# Patient Record
Sex: Male | Born: 1935 | Race: White | Hispanic: No | Smoking: Current every day smoker
Health system: Southern US, Community
[De-identification: ages and names within clinical notes are randomized; demographics above are authoritative.]

## PROBLEM LIST (undated history)

## (undated) DIAGNOSIS — I10 Essential (primary) hypertension: Secondary | ICD-10-CM

## (undated) DIAGNOSIS — K2270 Barrett's esophagus without dysplasia: Secondary | ICD-10-CM

## (undated) DIAGNOSIS — K5790 Diverticulosis of intestine, part unspecified, without perforation or abscess without bleeding: Secondary | ICD-10-CM

## (undated) HISTORY — DX: Diverticulosis of intestine, part unspecified, without perforation or abscess without bleeding: K57.90

## (undated) HISTORY — DX: Barrett's esophagus without dysplasia: K22.70

## (undated) HISTORY — DX: Essential (primary) hypertension: I10

---

## 1964-09-29 HISTORY — PX: OTHER SURGICAL HISTORY: SHX169

## 2007-09-30 DIAGNOSIS — I10 Essential (primary) hypertension: Secondary | ICD-10-CM

## 2007-09-30 HISTORY — DX: Essential (primary) hypertension: I10

## 2009-09-07 ENCOUNTER — Ambulatory Visit: Payer: Self-pay | Admitting: Internal Medicine

## 2009-10-02 ENCOUNTER — Ambulatory Visit: Payer: Self-pay | Admitting: Internal Medicine

## 2009-10-20 ENCOUNTER — Emergency Department: Payer: Self-pay | Admitting: Emergency Medicine

## 2009-11-08 ENCOUNTER — Ambulatory Visit: Payer: Self-pay | Admitting: Internal Medicine

## 2009-12-04 ENCOUNTER — Ambulatory Visit: Payer: Self-pay | Admitting: Surgery

## 2009-12-05 ENCOUNTER — Ambulatory Visit: Payer: Self-pay | Admitting: Surgery

## 2009-12-10 ENCOUNTER — Inpatient Hospital Stay: Payer: Self-pay | Admitting: Surgery

## 2009-12-11 ENCOUNTER — Ambulatory Visit: Payer: Self-pay | Admitting: Cardiovascular Disease

## 2010-09-29 DIAGNOSIS — K5790 Diverticulosis of intestine, part unspecified, without perforation or abscess without bleeding: Secondary | ICD-10-CM

## 2010-09-29 HISTORY — PX: UPPER GI ENDOSCOPY: SHX6162

## 2010-09-29 HISTORY — DX: Diverticulosis of intestine, part unspecified, without perforation or abscess without bleeding: K57.90

## 2010-09-29 HISTORY — PX: COLONOSCOPY: SHX174

## 2011-01-25 IMAGING — CT CT ANGIOGRAPHY NECK
1 of 4 series · 12 of 33 positions shown · IV contrast (APPLIED)
Comparison: none

REASON FOR EXAM: bilateral carotid stenosis
COMMENTS:

[Series 4: soft tissue · axial · 0.53mm/px · z∈[-306,-45]mm · 12 of 103 slices shown]
[im 8/103  soft-tissue]
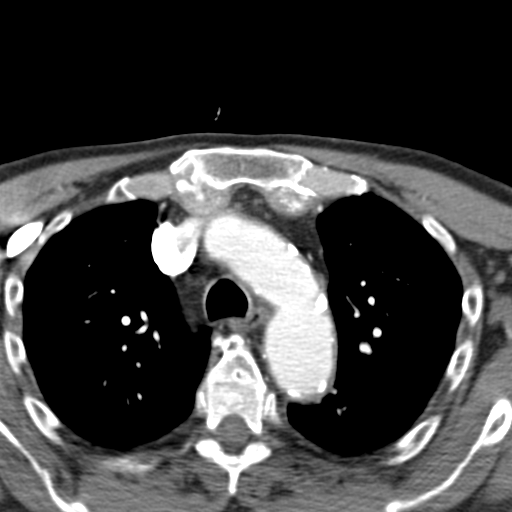
[im 16/103  bone]
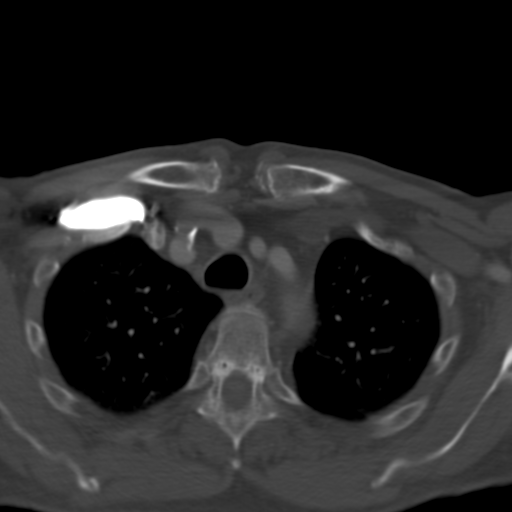
[im 24/103  soft-tissue]
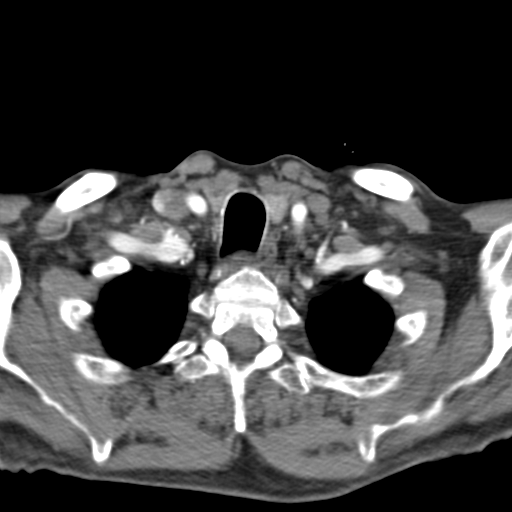
[im 32/103  bone]
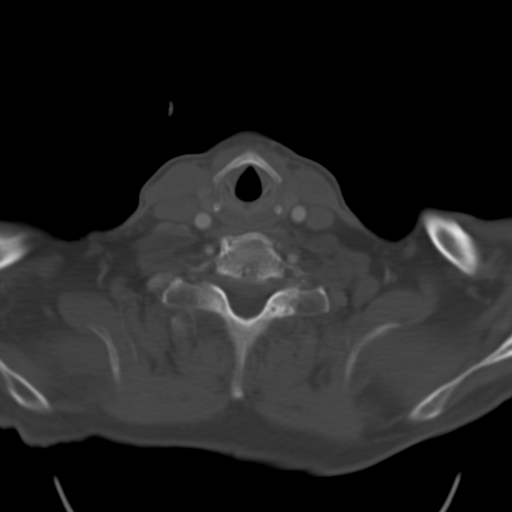
[im 40/103  soft-tissue]
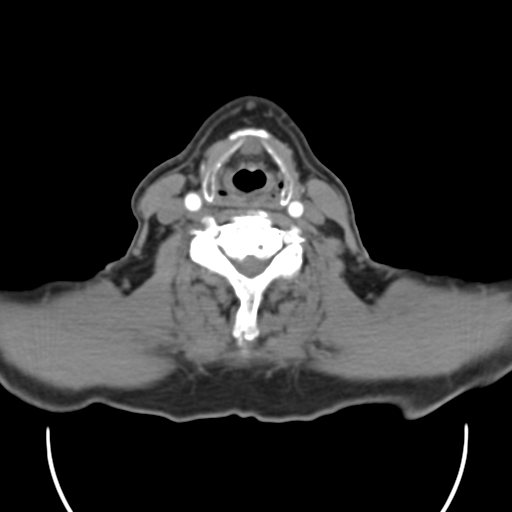
[im 48/103  bone]
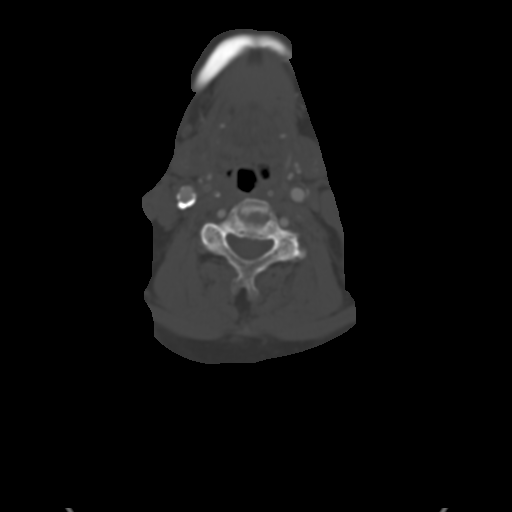
[im 55/103  soft-tissue]
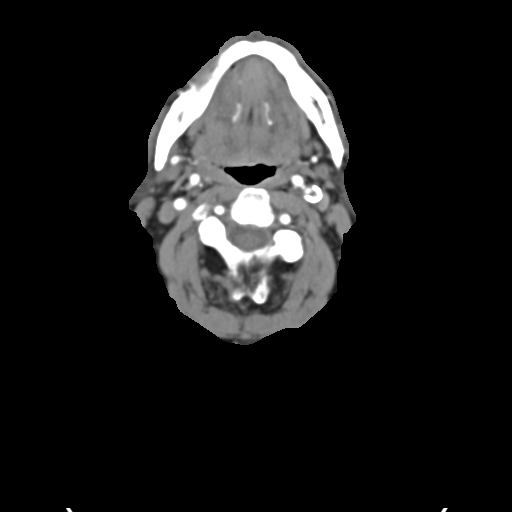
[im 63/103  bone]
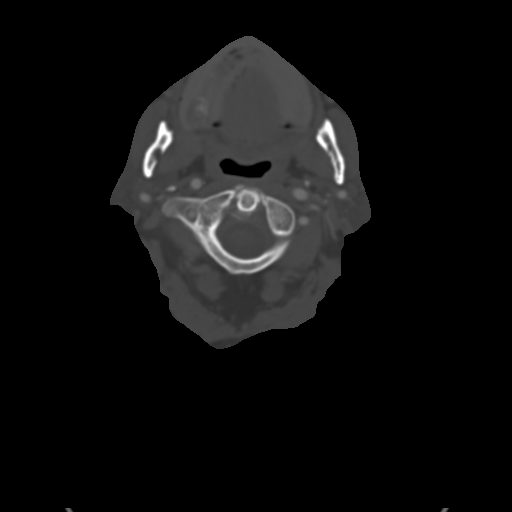
[im 71/103  soft-tissue]
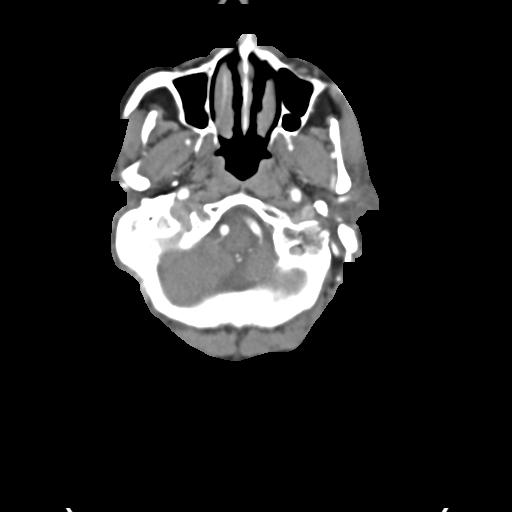
[im 79/103  bone]
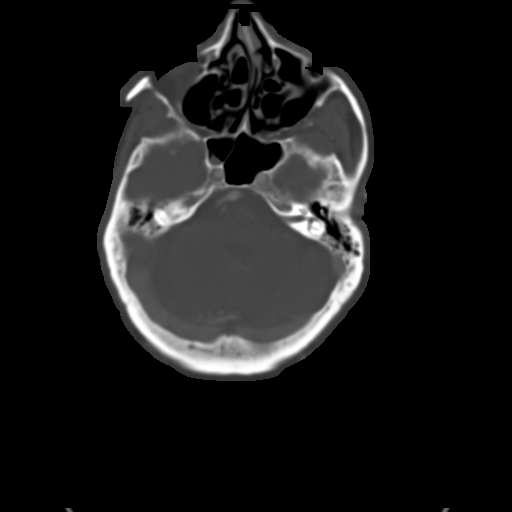
[im 87/103  soft-tissue]
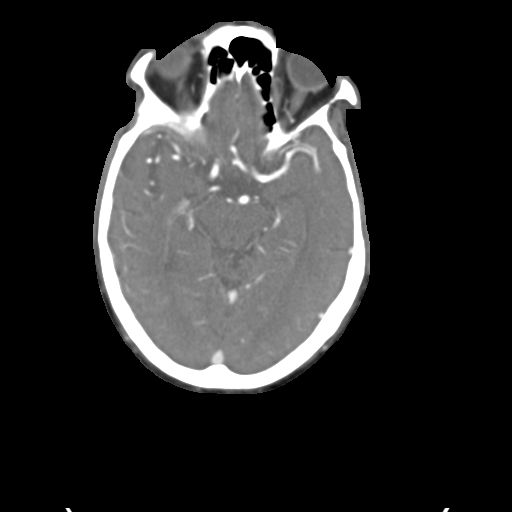
[im 95/103  bone]
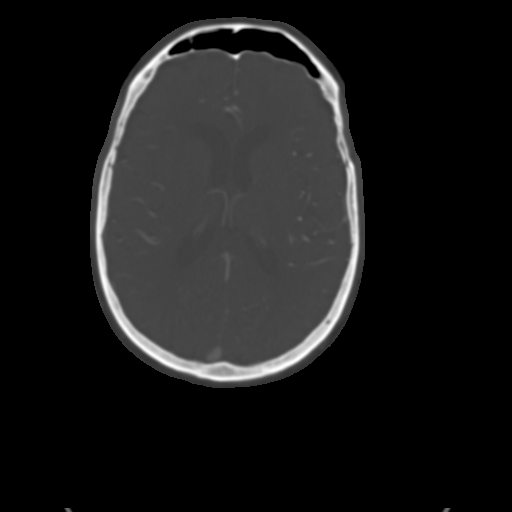

[12 of 33 positions shown; findings below may reference images not displayed]

PROCEDURE:     CT  - CT ANGIOGRAPHY NECK W/CONTRAST  - November 08, 2009  [DATE]

RESULT:

Axial source images were obtained as well as coronal and sagittal maximum
intensity projections status post intravenous administration of 100 ml
Isovue 370. Also included are 3-D reconstructions.

Evaluation of the left carotid system demonstrates coarse mural
calcifications within the carotid bulb. These extend into the origin of the
internal carotid artery. Findings concerning for high-grade stenosis at the
origin of the internal carotid artery are appreciated. This appears to be on
the magnitude of 90 to 95%. There is also narrowing at the origin of the
external carotid artery of the magnitude of 80 to 85%. Moderate to severe
narrowing is appreciated within the carotid bulb on the magnitude of 60 to
65%. The carotid bulb findings are partially obscured by coarse mural
calcifications.

Evaluation of the right carotid system demonstrates coarse mural
calcifications within the carotid bulb extending into the proximal internal
carotid artery. There is a region concerning for high-grade stenosis along
the proximal internal carotid artery. This is concerning for stenosis in the
magnitude of 80 to 85% though this finding is partially obscured by the
course mural calcifications. Mild to moderate stenosis is appreciated within
the carotid bulb on the magnitude of 40 to 45%.

No further areas of segmental nor focal narrowing are identified nor
fusiform or focal outpouchings within the right or left carotid systems.
IMPRESSION: 1. Findings concerning for/consistent with areas of hemodynamically
significant stenosis within the proximal right carotid artery as well as the
origin and proximal left carotid arteries. These are the internal carotid
arteries.
2. These findings are partially obscured by coarse mural calcifications and
further evaluation with Vascular Interventional consultation is recommended.

## 2011-02-18 ENCOUNTER — Ambulatory Visit: Payer: Self-pay

## 2011-09-12 ENCOUNTER — Ambulatory Visit: Payer: Self-pay | Admitting: General Surgery

## 2012-05-06 IMAGING — CR DG HAND COMPLETE 3+V*L*
1 series · 4 of 4 positions shown · non-contrast
Comparison: none

REASON FOR EXAM: left hand finger pain---all fingers
COMMENTS:

[Series 1: view not recorded · 0.17mm/px · 4 of 4 slices shown]
[im 1/4]
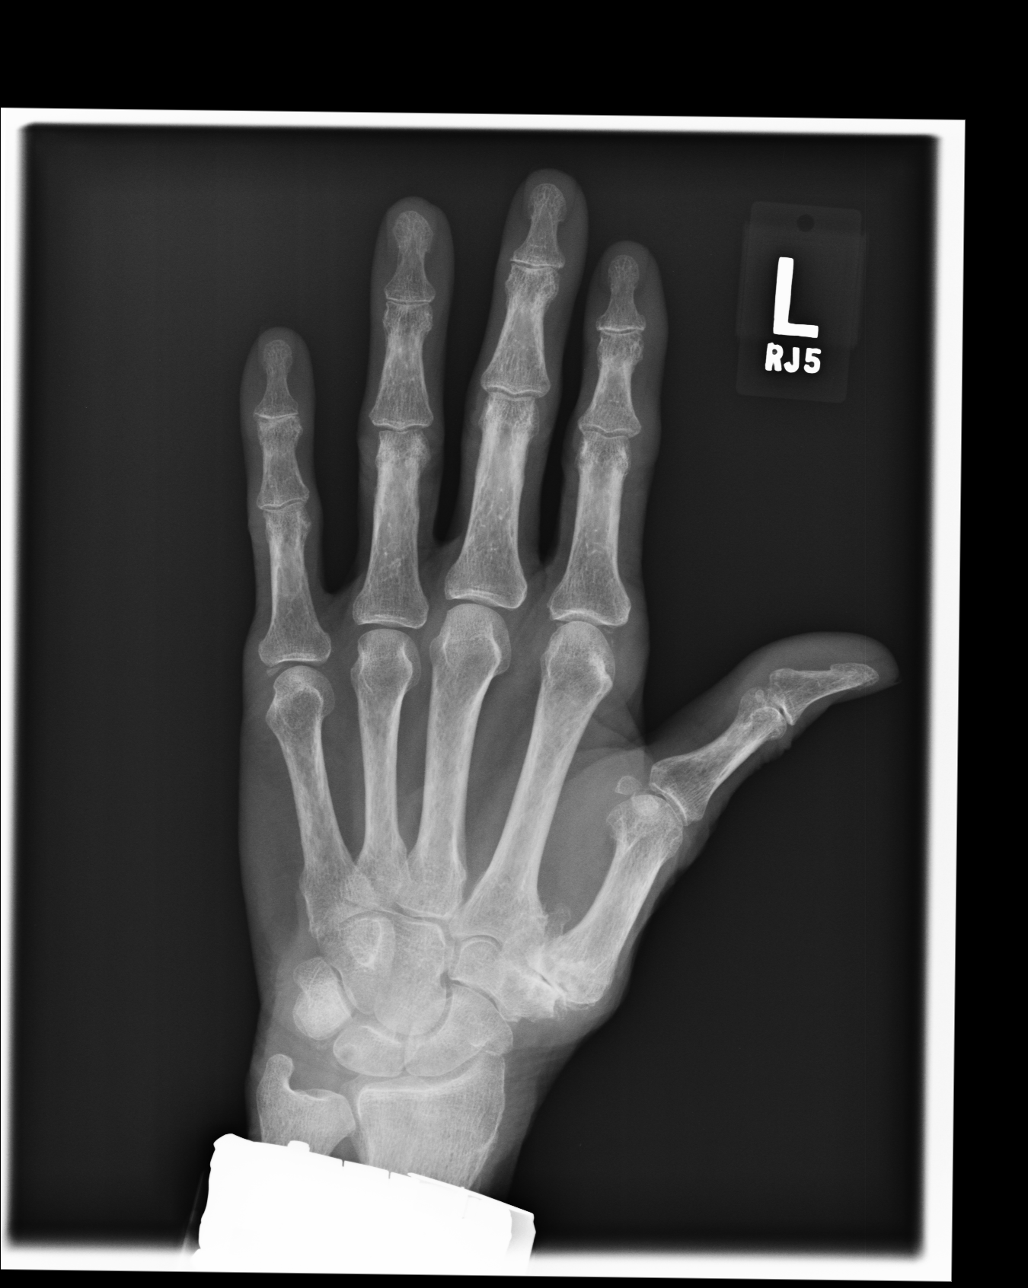
[im 2/4]
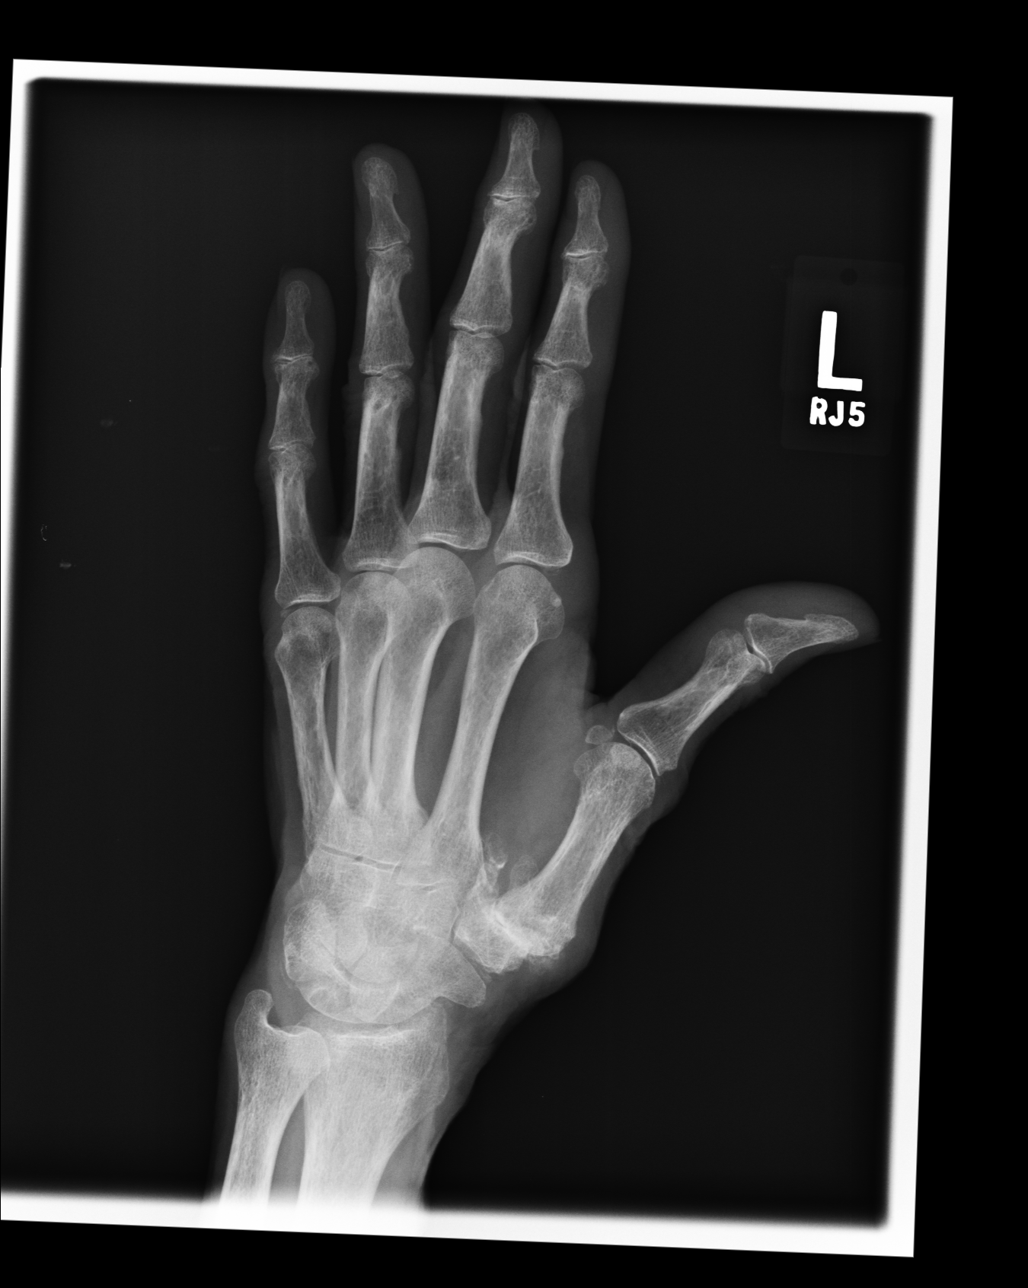
[im 3/4]
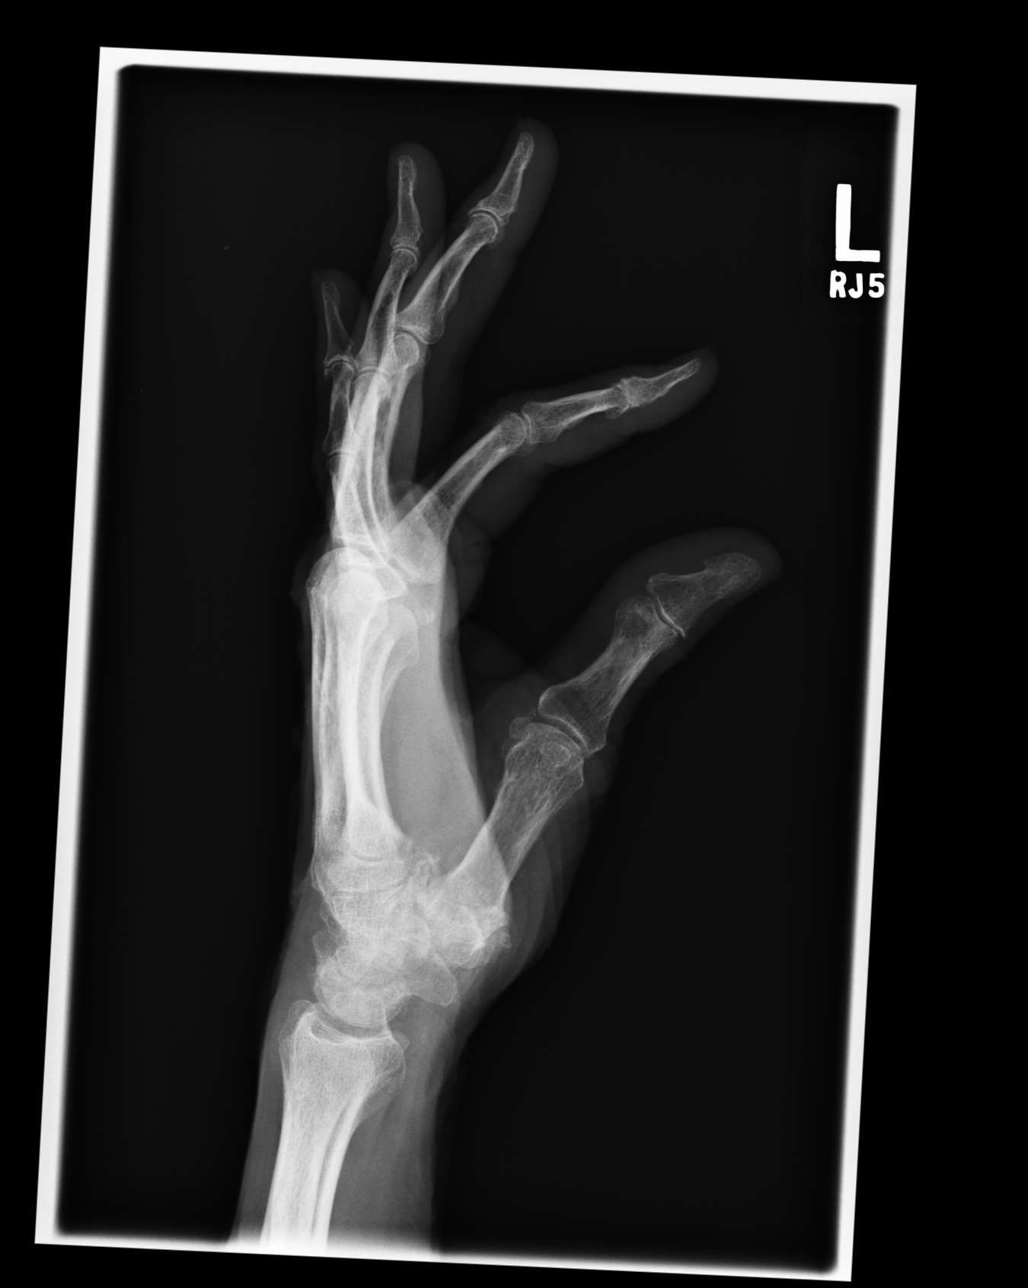
[im 4/4]
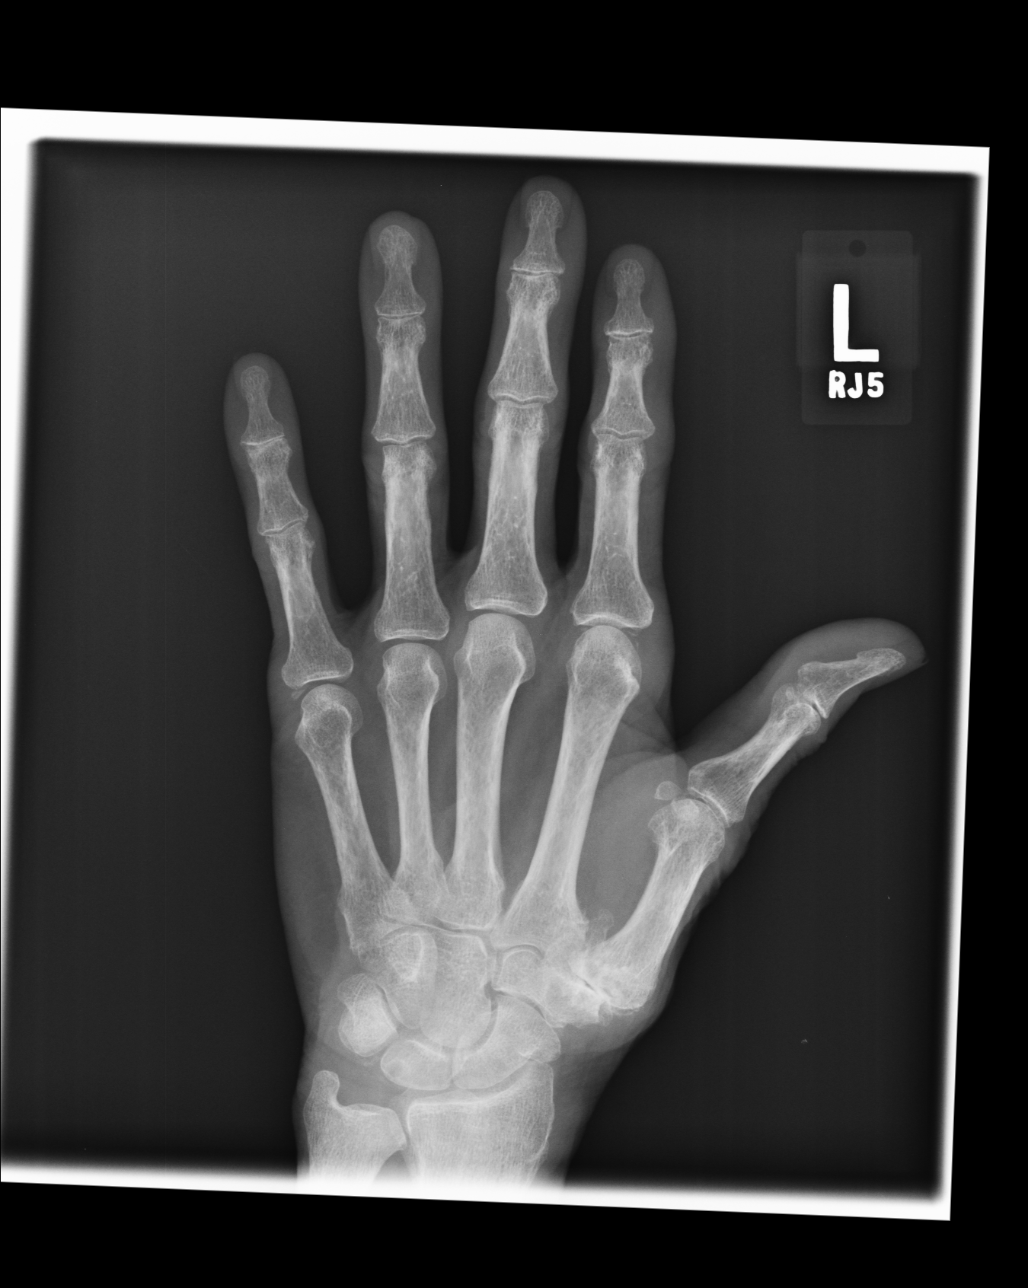

[4 of 4 positions shown; findings below may reference images not displayed]

PROCEDURE:     DXR - DXR HAND LT COMPLETE  W/OBLIQUES  - February 18, 2011 [DATE]

RESULT:     No fracture, dislocation or other acute bony abnormality is
identified. There is narrowing of the DIP joints of the second through the
fifth fingers compatible with arthritic change. Mild spur formation is also
present. No definite arthritic changes are seen at the PIP or MP joints.
Arthritic change is noted at the first carpometacarpal articulation. No
lytic or blastic lesions are seen.
IMPRESSION: 1. Arthritic change is noted at the DIP joints of the second through the
fifth fingers as noted above.
2. Arthritic change is also noted at the articulation of the first
metacarpal with the greater multangular.

## 2013-01-10 ENCOUNTER — Encounter: Payer: Self-pay | Admitting: General Surgery

## 2013-03-03 ENCOUNTER — Ambulatory Visit (INDEPENDENT_AMBULATORY_CARE_PROVIDER_SITE_OTHER): Payer: Medicare Other | Admitting: General Surgery

## 2013-03-03 ENCOUNTER — Encounter: Payer: Self-pay | Admitting: General Surgery

## 2013-03-03 VITALS — BP 118/62 | HR 72 | Resp 16 | Ht 69.0 in | Wt 173.0 lb

## 2013-03-03 DIAGNOSIS — K219 Gastro-esophageal reflux disease without esophagitis: Secondary | ICD-10-CM | POA: Insufficient documentation

## 2013-03-03 DIAGNOSIS — K2270 Barrett's esophagus without dysplasia: Secondary | ICD-10-CM

## 2013-03-03 NOTE — Progress Notes (Signed)
Patient ID: UNIQUE SEARFOSS, male   DOB: 1929/07/29, 79 y.o.   MRN: [id number]  Chief Complaint  Patient presents with  . Other    Barrett's esophagus    HPI Hayden Johnson is a 79 y.o. male here today for his six months Barrett's esophagus. The patient states he is doing well and has no complaints at this time. Pt has Barret's without dysplasia by prior endoscopy and biopsy. He is on Protonix but he cannot confirm his current meds-his daughter  Takes care of this. HPI  Past Medical History  Diagnosis Date  . Hypertension 2009  . Barrett's esophagus   . Diverticulosis 2012    Past Surgical History  Procedure Laterality Date  . Kidney stones  1966  . Colonoscopy  2012  . Upper gi endoscopy  2012    History reviewed. No pertinent family history.  Social History History  Substance Use Topics  . Smoking status: Current Every Day Smoker -- 0.50 packs/day for 30 years    Types: Cigarettes  . Smokeless tobacco: Never Used  . Alcohol Use: No    No Known Allergies  No current outpatient prescriptions on file.   No current facility-administered medications for this visit.    Review of Systems Review of Systems  Constitutional: Negative.   Respiratory: Negative.   Cardiovascular: Negative.     Blood pressure 118/62, pulse 72, resp. rate 16, height 5\' 9"  (1.753 m), weight 173 lb (78.472 kg).  Physical Exam Physical Exam  Constitutional: He appears well-developed and well-nourished.  Eyes: Conjunctivae are normal. No scleral icterus.  Neck: Trachea normal. No mass and no thyromegaly present.  Cardiovascular: Normal rate, regular rhythm, normal heart sounds and normal pulses.   No murmur heard. Pulmonary/Chest: Effort normal and breath sounds normal.  Abdominal: Soft. Normal appearance and bowel sounds are normal. There is no hepatosplenomegaly. There is no tenderness. No hernia.    Data Reviewed none  Assessment    Barret.s esophagus -symptomatically better.      Plan    Need to verify his meds.No need for endoscopy as long as he is asymptomatic.        Lorcan Shelp G 03/04/2013, 6:29 AM

## 2013-03-03 NOTE — Patient Instructions (Addendum)
Patient advised to continue use of Protonix. As patient is not aware of medications will have RN confirm his medications with his daughter or granddaughter.

## 2013-03-04 ENCOUNTER — Encounter: Payer: Self-pay | Admitting: General Surgery

## 2013-06-28 ENCOUNTER — Inpatient Hospital Stay: Payer: Self-pay | Admitting: Internal Medicine

## 2013-06-28 LAB — TROPONIN I: Troponin-I: 0.06 ng/mL — ABNORMAL HIGH

## 2013-06-28 LAB — BASIC METABOLIC PANEL
BUN: 56 mg/dL — ABNORMAL HIGH (ref 7–18)
Calcium, Total: 9.1 mg/dL (ref 8.5–10.1)
Co2: 24 mmol/L (ref 21–32)
Creatinine: 4.78 mg/dL — ABNORMAL HIGH (ref 0.60–1.30)
EGFR (African American): 12 — ABNORMAL LOW
Osmolality: 301 (ref 275–301)
Potassium: 4.8 mmol/L (ref 3.5–5.1)

## 2013-06-28 LAB — PRO B NATRIURETIC PEPTIDE: B-Type Natriuretic Peptide: 32243 pg/mL — ABNORMAL HIGH (ref 0–450)

## 2013-06-28 LAB — URINALYSIS, COMPLETE
Blood: NEGATIVE
Glucose,UR: 50 mg/dL (ref 0–75)
Hyaline Cast: 3
Ketone: NEGATIVE
Protein: 100
RBC,UR: 1 /HPF (ref 0–5)

## 2013-06-28 LAB — CBC
HGB: 9.9 g/dL — ABNORMAL LOW (ref 13.0–18.0)
MCHC: 33.8 g/dL (ref 32.0–36.0)
Platelet: 122 10*3/uL — ABNORMAL LOW (ref 150–440)
RBC: 3.02 10*6/uL — ABNORMAL LOW (ref 4.40–5.90)
RDW: 14.2 % (ref 11.5–14.5)
WBC: 10 10*3/uL (ref 3.8–10.6)

## 2013-06-28 LAB — CK TOTAL AND CKMB (NOT AT ARMC)
CK, Total: 98 U/L (ref 35–232)
CK, Total: 122 U/L (ref 35–232)
CK-MB: 3.2 ng/mL (ref 0.5–3.6)
CK-MB: 3.1 ng/mL (ref 0.5–3.6)
CK-MB: 2.6 ng/mL (ref 0.5–3.6)

## 2013-06-28 LAB — HEPATIC FUNCTION PANEL A (ARMC)
Albumin: 3.7 g/dL (ref 3.4–5.0)
Bilirubin,Total: 0.5 mg/dL (ref 0.2–1.0)
SGPT (ALT): 23 U/L (ref 12–78)

## 2013-06-29 LAB — PROTEIN / CREATININE RATIO, URINE
Creatinine, Urine: 162.7 mg/dL — ABNORMAL HIGH (ref 30.0–125.0)
Protein/Creat. Ratio: 2028 mg/gCREAT — ABNORMAL HIGH (ref 0–200)

## 2013-06-29 LAB — CBC WITH DIFFERENTIAL/PLATELET
Basophil %: 0 %
Eosinophil #: 0 10*3/uL (ref 0.0–0.7)
HCT: 26.7 % — ABNORMAL LOW (ref 40.0–52.0)
Lymphocyte #: 0.4 10*3/uL — ABNORMAL LOW (ref 1.0–3.6)
MCH: 33 pg (ref 26.0–34.0)
Monocyte #: 0.1 x10 3/mm — ABNORMAL LOW (ref 0.2–1.0)
Monocyte %: 1.8 %
Neutrophil #: 7.3 10*3/uL — ABNORMAL HIGH (ref 1.4–6.5)
Neutrophil %: 93 %
Platelet: 95 10*3/uL — ABNORMAL LOW (ref 150–440)
RDW: 14.2 % (ref 11.5–14.5)
WBC: 7.8 10*3/uL (ref 3.8–10.6)

## 2013-06-29 LAB — IRON AND TIBC
Iron Bind.Cap.(Total): 261 ug/dL (ref 250–450)
Iron Saturation: 23 %
Iron: 60 ug/dL — ABNORMAL LOW (ref 65–175)
Unbound Iron-Bind.Cap.: 201 ug/dL

## 2013-06-29 LAB — BASIC METABOLIC PANEL
Anion Gap: 10 (ref 7–16)
BUN: 69 mg/dL — ABNORMAL HIGH (ref 7–18)
Calcium, Total: 8.8 mg/dL (ref 8.5–10.1)
Chloride: 107 mmol/L (ref 98–107)
Creatinine: 5.59 mg/dL — ABNORMAL HIGH (ref 0.60–1.30)
EGFR (Non-African Amer.): 9 — ABNORMAL LOW
Glucose: 164 mg/dL — ABNORMAL HIGH (ref 65–99)
Osmolality: 301 (ref 275–301)
Potassium: 4.9 mmol/L (ref 3.5–5.1)
Sodium: 139 mmol/L (ref 136–145)

## 2013-06-29 LAB — LIPID PANEL
Cholesterol: 136 mg/dL (ref 0–200)
Ldl Cholesterol, Calc: 79 mg/dL (ref 0–100)
Triglycerides: 56 mg/dL (ref 0–200)
VLDL Cholesterol, Calc: 11 mg/dL (ref 5–40)

## 2013-06-29 LAB — FERRITIN: Ferritin (ARMC): 154 ng/mL (ref 8–388)

## 2013-06-30 LAB — BASIC METABOLIC PANEL
Anion Gap: 10 (ref 7–16)
Chloride: 102 mmol/L (ref 98–107)
EGFR (African American): 10 — ABNORMAL LOW
Osmolality: 301 (ref 275–301)
Potassium: 4.7 mmol/L (ref 3.5–5.1)

## 2013-06-30 LAB — PHOSPHORUS: Phosphorus: 4.5 mg/dL (ref 2.5–4.9)

## 2013-06-30 LAB — PROTEIN ELECTROPHORESIS(ARMC)

## 2013-07-01 LAB — BASIC METABOLIC PANEL
Chloride: 100 mmol/L (ref 98–107)
EGFR (African American): 13 — ABNORMAL LOW
Potassium: 4.6 mmol/L (ref 3.5–5.1)

## 2013-07-01 LAB — UR PROT ELECTROPHORESIS, URINE RANDOM

## 2013-07-03 LAB — CBC WITH DIFFERENTIAL/PLATELET
Basophil #: 0 10*3/uL (ref 0.0–0.1)
Eosinophil %: 0 %
HCT: 28.7 % — ABNORMAL LOW (ref 40.0–52.0)
HGB: 9.8 g/dL — ABNORMAL LOW (ref 13.0–18.0)
Lymphocyte #: 0.6 10*3/uL — ABNORMAL LOW (ref 1.0–3.6)
Lymphocyte %: 5.1 %
MCHC: 34.2 g/dL (ref 32.0–36.0)
Monocyte %: 6.5 %
Neutrophil %: 88.3 %
Platelet: 119 10*3/uL — ABNORMAL LOW (ref 150–440)
RBC: 2.98 10*6/uL — ABNORMAL LOW (ref 4.40–5.90)

## 2014-09-13 ENCOUNTER — Ambulatory Visit: Payer: Self-pay | Admitting: Vascular Surgery

## 2014-09-14 IMAGING — CR DG CHEST 1V PORT
1 series · 1 of 1 positions shown · non-contrast
Comparison: none

REASON FOR EXAM: Shortness of breath
COMMENTS:

PROCEDURE:     DXR - DXR PORTABLE CHEST SINGLE VIEW  - June 28, 2013  [DATE]
RESULT:     Comparison is made to prior study dated 10/02/2009.

[ap]
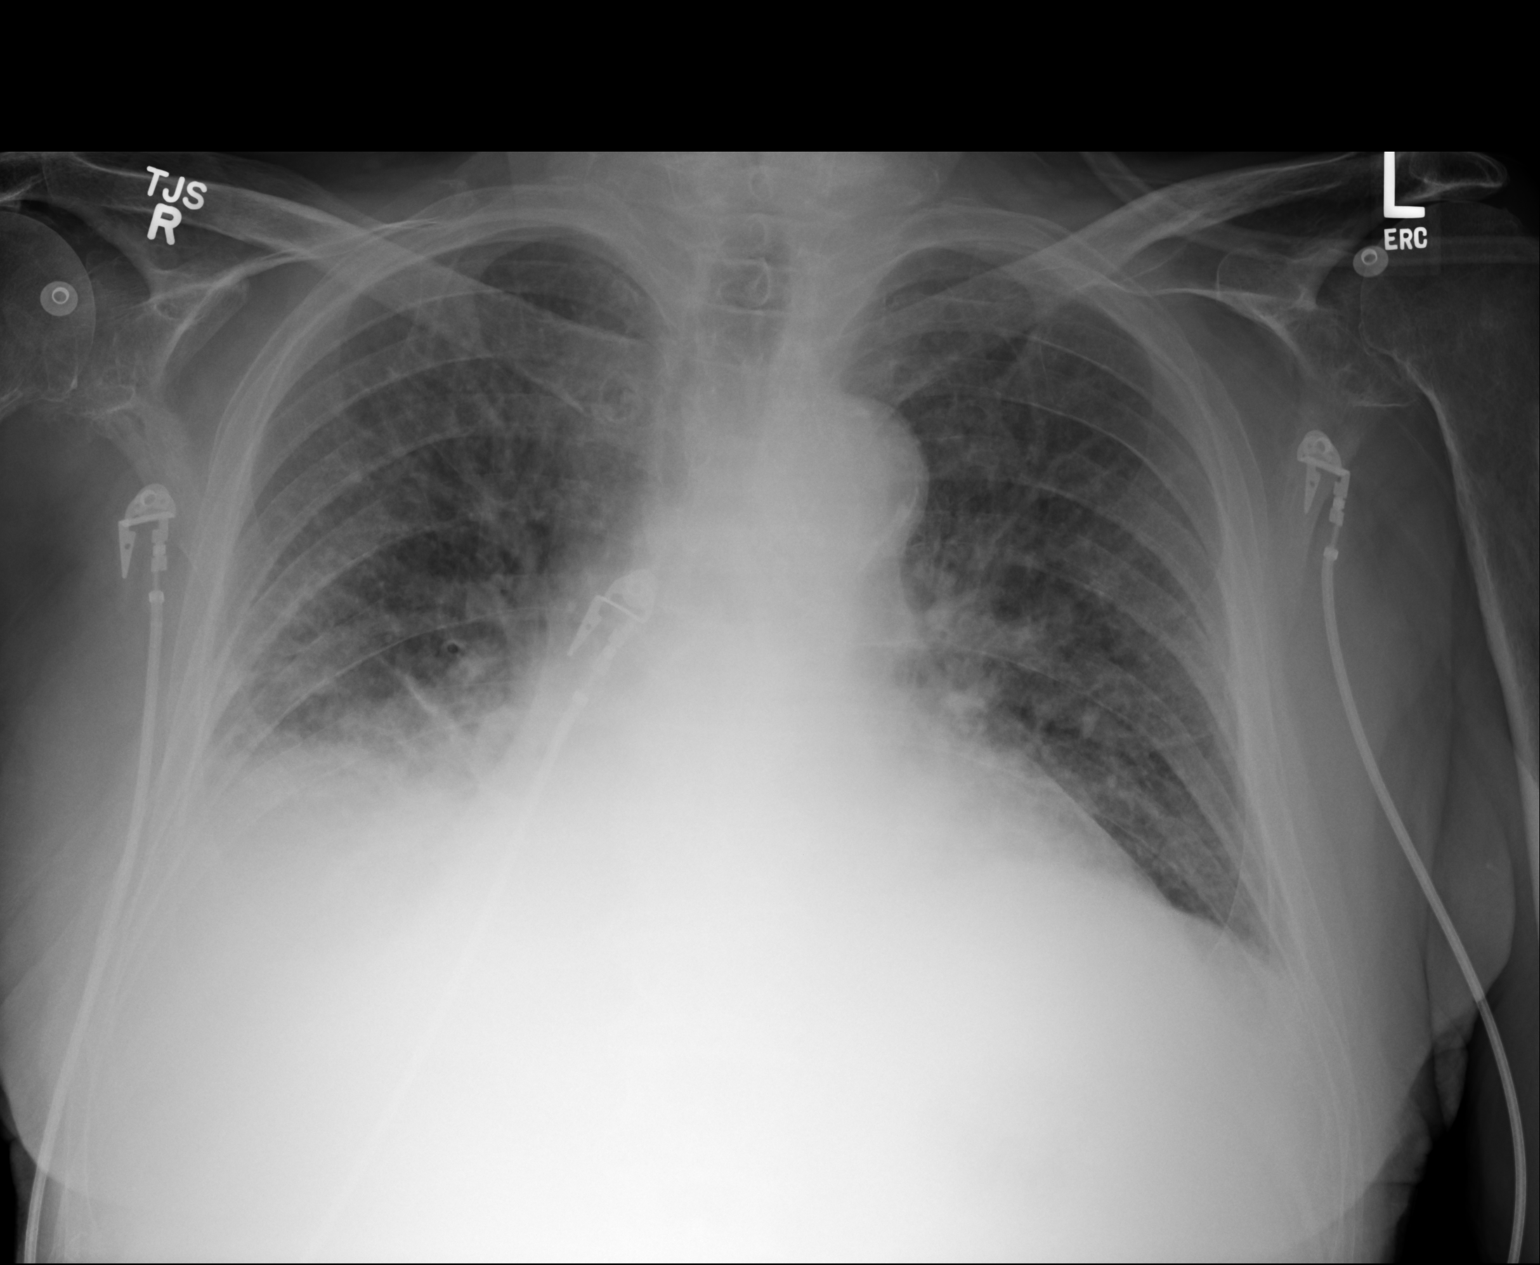

[1 of 1 positions shown; findings below may reference images not displayed]

FINDINGS: The patient has taken a shallow inspiration. There is prominence
of the interstitial markings and indistinctness of the pulmonary
vasculature. An area of increased density projects within the right lung
base. There is peribronchial cuffing. The cardiac silhouette appears
enlarged. The visualized bony skeleton is unremarkable.
IMPRESSION: 1. Shallow inspiration.
2. Interstitial infiltrate likely representing pulmonary edema. An
infectious or inflammatory infiltrate cannot be excluded.
3. Focal edema versus atelectasis versus focal infiltrate within the right
lung base as well as likely small effusion. Surveillance evaluation
recommended.

## 2015-01-19 NOTE — Discharge Summary (Signed)
PATIENT NAME:  Hayden Johnson, Hayden Johnson MR#:  [id number] DATE OF BIRTH:  01-22-1929  DATE OF ADMISSION:  06/28/2013 DATE OF DISCHARGE:  07/04/2013  PRESENTING COMPLAINT: Shortness of breath.   DISCHARGE DIAGNOSES: 1.  Acute respiratory failure secondary to volume overload from acute pulmonary edema/congestive heart failure, acute diastolic.  2.  End-stage renal disease now initiated on hemodialysis.  3.  Hypertension.   CONDITION ON DISCHARGE: Fair.   CODE STATUS: FULL CODE.   DISCHARGE MEDICATIONS: 1.  Tylenol 325 mg 2 tablets every 6 hours as needed.  2.  Amlodipine 10 mg daily.  3.  Aspirin 81 mg daily.  4.  Citalopram 20 mg daily.  5.  Lasix 40 mg b.i.d.  6.  Hydralazine 25 mg q. 8 hourly.  7.  Lisinopril 40 mg daily.  8.  Protonix 40 mg b.i.d.  9.  Sodium bicarbonate 650 mg p.o. b.i.d.   DIET: Renal diet.   DISCHARGE INSTRUCTIONS: 1.  Follow up with Dr. Beverely RisenFozia Khan as outpatient.  2.  Follow up with Dr. Cline CrockSingh/Dr. Lateef on your dialysis days, which is Tuesday, Thursday and Saturday.   CONSULTATIONS: 1.  Dr. Juliann Paresallwood, cardiology.  2.  Dr. Cherylann RatelLateef and Dr. Thedore MinsSingh, nephrology.   BRIEF SUMMARY OF HOSPITAL COURSE:  Mr. Hayden Ageeaul Bienkowski is an 81 year old Caucasian gentleman who has history of CKD and COPD along with hypertension came into the Emergency Room with: 1.  Acute hypoxic respiratory failure secondary to acute pulmonary edema from volume overload secondary to end-stage renal disease. The patient had dialysis graft present and hemodialysis was initiated and ultrafiltration was done. The patient has been set up as outpatient with hemodialysis Tuesday, Thursday and Saturday. He will follow up with Dr. Thedore MinsSingh and Dr. Cherylann RatelLateef is his primary nephrologist. His sats are 95% on room.  2.  End-stage renal disease. Initiated hemodialysis during this hospitalization.  3.  Acute COPD exacerbation. The patient received some prednisone. He is not wheezing and his prednisone was discontinued. His nebulizers were  given p.r.n. and he was satting 95% on room.  4.  Hypertension. Resumed lisinopril and hydralazine.  5.  Mild elevation of troponins due to CHF with demand ischemia in the setting of end-stage renal disease. The patient was seen by Dr. Juliann Paresallwood. No further cardiac procedure was indicated.  6.  PVD. Continue aspirin.  7.  Hyperlipidemia. Cholesterol remained stable.   Hospital stay otherwise remained stable. The patient remained a FULL CODE.   TIME SPENT: 40 minutes.  ____________________________ Wylie HailSona A. Allena KatzPatel, MD sap:sb D: 07/04/2013 13:46:18 ET T: 07/04/2013 14:32:55 ET JOB#: [id number]  cc: Masyn Rostro A. Allena KatzPatel, MD, <Dictator> Dwayne D. Juliann Paresallwood, MD Munsoor Lizabeth LeydenN. Lateef, MD Mosetta PigeonHarmeet Singh, MD Willow OraSONA A Antoni Stefan MD ELECTRONICALLY SIGNED 07/22/2013 14:58

## 2015-01-19 NOTE — H&P (Signed)
PATIENT NAME:  Hayden Johnson, Hayden Johnson MR#:  [id number] DATE OF BIRTH:  June 09, 1929  DATE OF ADMISSION:  06/28/2013  REFERRING PHYSICIAN: Dr. Marta Antu, M.D.   PRIMARY CARE PHYSICIAN: Currently the patient is having all of his care done at [facility], he used to follow with Dr. Nehemiah Massed and Dr. Clayborn Bigness in the past but has not seen them for the last year.   CHIEF COMPLAINT: Shortness of breath.   HISTORY OF PRESENT ILLNESS: This is an 81 year old male with significant past medical history of chronic kidney disease and congestive heart failure, last EF 40% last year. Valvular heart disease, hypertension and hyperlipidemia,  presents with complaints of shortness of breath. The patient reports he woke up at night with severe shortness of breath where he called EMS. The patient today presented to ED with hypoxia with saturating of 70% on 12 liters nasal cannula. The patient was started on BiPAP immediately. The patient had chest x-ray done which did show bilateral pulmonary edema with right pleural effusion, as well the patient has uncontrolled blood pressure upon presentation 190/95. He received sublingual nitroglycerin and received 80 of IV Lasix in the ED. The patient had no output, bladder scan was checked. The patient  had 375 mL of urine in bladder. The patient denies any chest pain. His troponin came back at 0.06. The patient is an extremely poor historian, cannot give a very reliable history, but he reports that he follows at the [facility], so we cannot obtain any recent labs for him. His labs were significant for elevated BNP at 32,243, unknown baseline to [place]. As well, his creatinine came back at 4.78, the last value we have on him was almost two years ago with a creatinine of 1.8, we have no recent values. His medical records were requested from the [facility] hospital. The patient improved after the Lasix and sublingual nitroglycerin and leave on BiPAP. Currently he is tolerating 3 liters nasal cannula. The  patient reports he was told in the past he possibly has emphysema, was never officially diagnosed with that. He is an still active smoker, smokes five cigarettes per day. He denies any cough, any productive sputum. Hospitalist service was requested to admit the patient for further management of his respiratory failure.   PAST MEDICAL HISTORY: 1. Hypertension.  2. Hyperlipidemia.  3. Valvular heart disease.  4. Chronic kidney disease.  5. Sleep apnea.  6. Peripheral vascular disease with left carotid endarterectomy.   SOCIAL HISTORY: The patient smokes five cigarettes per day. No alcohol use. Lives at home.   FAMILY HISTORY: The patient was asked and he denies any coronary artery disease in the family at a young age.   ALLERGIES: NOVOCAINE ASPIRIN, per  PREVIOUS MEDICAL RECORDS.  HOME MEDICATIONS: The patient cannot recall any of his medications; I requested the medication list from the [facility]: The patient is an extremely poor historian but try to obtain it to my best.  GENERAL: Denies any fever, chills. Complains of fatigue, weakness. Denies weight gain, weight loss.  EYES: Denies blurry vision, double vision, inflammation, glaucoma.  ENT: Denies any tinnitus, ear pain, epistaxis or discharge.  RESPIRATORY: Denies any cough, wheezing, complains of shortness of breath. Denies any painful respirations or asthma. Reports someone told him he possibly had emphysema.  CARDIOVASCULAR: Denies any chest pain, palpitations, syncope or arrhythmias, edema.  GASTROINTESTINAL: Denies nausea, vomiting, diarrhea, abdominal pain, hematemesis, melena, jaundice, rectal bleed.  GENITOURINARY: Denies dysuria, hematuria, renal colic.  ENDOCRINE: Denies polyuria, polydipsia, heat or cold intolerance.  HEMATOLOGY: Denies anemia, easy bruising, bleeding diathesis. INTEGUMENTARY:  Denies acne, rash or skin lesions.  MUSCULOSKELETAL: Denies any neck pain, back pain, cramps or gout.   NEUROLOGIC: Denies CVA, TIA, headache, vertigo, tremors.  PSYCHIATRIC: Denies anxiety, insomnia, bipolar disorder or schizophrenia.   PHYSICAL EXAMINATION: VITAL SIGNS: Temperature 97, pulse 78, respiratory rate 19, blood pressure 153/77, saturating 93% on 3 L nasal cannula. Initially upon presentation had pulse of 101, respiratory rate 42, blood pressure of 190/95.  GENERAL: Elderly male who looks comfortable, in no apparent distress.  HEENT: Head is atraumatic, normocephalic. Pupils equal, reactive to light. Pink conjunctivae. Anicteric sclerae. Moist oral mucosa.  NECK: Supple. No thyromegaly. Mild JVD.  CHEST: The patient had decreased air entry bilaterally with mild wheezing. No rales or rhonchi. Has crackles bilaterally.  CARDIOVASCULAR: S1, S2 heard. No rubs, or gallops. Has mild systolic ejection murmur 2/6.  ABDOMEN: Soft, nontender, nondistended. Bowel sounds present.  EXTREMITIES: +1 edema bilaterally. No clubbing. No cyanosis. Pedal pulses felt bilaterally.  MUSCULOSKELETAL: No joint effusion or erythema.  SKIN: Normal skin turgor. Warm and dry. Has left arm AV fistula with good bruits heard.  PSYCHIATRIC: The patient is awake, alert x 3, appears to be mildly confused, some questions could not answer appropriately, but he knows the exact date.  NEUROLOGICAL: Nerves grossly intact. Motor 5/5. No focal deficits.  LYMPHATIC: No cervical, supraclavicular lymphadenopathy.   PERTINENT LABORATORY DATA: Glucose 180. BNP stayed at 2243, BUN 56, creatinine 4.78, sodium 141, potassium 4.8, chloride 109, CO2 24, anion gap 8. Total protein 7.5, total bilirubin 0.5, ALT 23, AST 23, alk phos 68. Troponin 0.06. Total CK 98.   White blood cell 10, hemoglobin 9.9, hematocrit 29.4, platelets 122, pH 7.26, pCO2 47, pO2 of 88 on nonrebreather.   Chest x-ray showing bilateral haziness, most likely due to interstitial edema with possible right lung effusion.   EKG showing regular rhythm with P wave  viewed before each QRS complex, even though treat as accelerated junctional rhythm, appears to be normal sinus rhythm at 117 beats per minute with minimal ST depression in the lateral leads.   ASSESSMENT AND PLAN: 1. Acute respiratory failure. Appears to be acute hypoxic respiratory failure. This appears to be multifactorial, mainly due to pulmonary edema and volume overload. This is most likely related to his advanced kidney disease and volume overload, as well due to congestive heart failure. The patient does have a history of systolic congestive heart failure, last ejection fraction was 40%, as well due to chronic obstructive pulmonary disease exacerbation. The patient currently is off BiPAP and tolerating nasal cannula.  2. Volume overload due to chronic kidney disease. The patient received IV Lasix, had 375  volume in the bladder scanned, so we will insert Foley catheter. We will consult nephrology service to see if we should proceed with aggressive diuresis, especially as baseline kidney function is unknown to [place] as last labs were done before two years, and we did not receive any blood work from the [facility] to reveal what is  his baseline, as well to see if the patient will need hemodialysis or not.  3. Congestive heart failure. Most likely systolic. Last ejection fraction 40%. We will repeat echo. We most likely will continue with diuresis and we will hold on ACE or ARB inhibitors due to his advanced kidney disease.  4. Chronic obstructive pulmonary disease exacerbation. We will start the patient on Solu-Medrol and nebulizers. Had  minimal wheezing with significantly decreased air entry.  5. Hypertension, uncontrolled, we will add p.r.n. hydralazine. We will continue him on diltiazem.  6. Elevated troponins, this is most likely related to demand ischemia, as well due to his chronic kidney disease. We will admit to telemetry. Continue to cycle his cardiac enzymes. We will consult cardiology for his  congestive heart failure and elevated troponin, as well, he does have some mild ST depression on the lateral leads.  7. Peripheral vascular disease. Continue with aspirin.  8. Hyperlipidemia. We do not have the patient's medication list yet, we will await to receive his medication list then  resume him back on his statin.  9. Deep vein thrombosis prophylaxis, subcutaneous heparin. Gastrointestinal prophylaxis, on proton pump inhibitor.   CODE STATUS: THE PATIENT IS FULL CODE.   TOTAL TIME SPENT ON ADMISSION AND PATIENT CARE: 60 minutes.    ____________________________ Albertine Patricia, MD dse:sg D: 06/28/2013 07:46:47 ET T: 06/28/2013 08:07:18 ET JOB#: [id number]  cc: Albertine Patricia, MD, <Dictator> Babbette Dalesandro Graciela Husbands MD ELECTRONICALLY SIGNED 07/09/2013 3:33

## 2015-01-19 NOTE — Consult Note (Signed)
PATIENT NAME:  Hayden Johnson, Hayden Johnson MR#:  [id number] DATE OF BIRTH:  10-09-1928  DATE OF CONSULTATION:  06/28/2013  REFERRING PHYSICIAN:  Huey Bienenstock, MD   CONSULTING PHYSICIAN:  Marcina Millard, MD  CHIEF COMPLAINT: Shortness of breath.   REASON FOR CONSULTATION: Consultation requested for evaluation of congestive heart failure.   HISTORY OF PRESENT ILLNESS: The patient is an 81 year old gentleman with a history of stage IV chronic kidney disease, recurrent congestive heart failure, mildly reduced left ventricular function. The patient was in his usual state of health until last night when he awoke with shortness of breath. He called EMS. The patient was brought to [facility] Emergency Room.  In the Emergency Room, the patient was noted to be hypoxic with a saturation of 70% on nasal cannula. The patient was started on BiPAP. Chest x-ray revealed bilateral pulmonary edema. The patient was treated with intravenous furosemide 80 mg with an output of 375 mL after Foley catheter was placed. The patient currently is followed at the [facility] with stage 4 chronic kidney disease. The patient has an AV fistula in his left arm in anticipation of potential dialysis in the future. He denies chest pain. Admission labs were notable for borderline elevated troponin of 0.06. The patient was also noted to be anemic with a hemoglobin and hematocrit of 9.9 and 29.4, respectively.   PAST MEDICAL HISTORY: 1.  Stage 4 chronic kidney disease.  2.  Hypertension.  3.  Hyperlipidemia.  4.  Valvular heart disease.  5.  History of sleep apnea.  6.  COPD.   MEDICATIONS: The patient is unable to recall medications he is currently taking.   SOCIAL HISTORY: The patient lives at home. He smokes 5 cigarettes a day.   FAMILY HISTORY: Positive for coronary artery disease.    REVIEW OF SYSTEMS:  CONSTITUTIONAL: No fever or chills.  EYES: No blurry vision.  EARS: No hearing loss.  RESPIRATORY: The patient has shortness  of breath, nonproductive cough.  CARDIOVASCULAR: The patient denies chest pain.  GASTROINTESTINAL: The patient denies nausea, vomiting, diarrhea, constipation.  GENITOURINARY: The patient denies dysuria, hematuria.  ENDOCRINE: No polyuria or polydipsia.  HEMATOLOGICAL: No easy bruising or bleeding.  INTEGUMENT: No rash.  MUSCULOSKELETAL: No arthralgias or myalgias.  NEUROLOGICAL: No focal muscle weakness or numbness.  PSYCHOLOGICAL: No depression or anxiety.   PHYSICAL EXAM: VITAL SIGNS: Blood pressure 146/76, pulse 73, respirations 20, temperature 98.5, pulse ox 91%.  HEAD, EYES, EARS, NOSE, AND THROAT:  Pupils equal, reactive to light and accommodation.  NECK: Supple without thyromegaly.  LUNGS: Reveal decreased breath sounds in both bases.  CARDIOVASCULAR: Normal JVP. Normal PMI. Regular rate and rhythm. Normal S1, S2. No appreciable gallop, murmur or rub.  ABDOMEN: Soft and nontender without hepatosplenomegaly.  EXTREMITIES: No cyanosis, clubbing or edema. Pulses were intact bilaterally.  MUSCULOSKELETAL: Normal muscle tone.  NEUROLOGICAL: The patient is alert and oriented x 3. Motor and sensory are both grossly intact.   IMPRESSION: An 81 year old gentleman with stage 4 chronic kidney disease, chronic obstructive pulmonary disease, valvular heart disease with pulmonary edema, likely multifactorial. The patient has responded to initial intravenous furosemide with diuresis and overall clinical improvement.   RECOMMENDATIONS: 1.  Agree with overall current therapy.  2.  Will continue diuresis.  3.  Would defer full-dose anticoagulation. Borderline elevated troponin likely due to demand/supply ischemia and not due to acute coronary syndrome.  4.  Review 2-D echocardiogram.  5.  Further recommendations pending echocardiogram results.   ____________________________ Marcina Millard,  MD ap:cb D: 06/28/2013 16:28:43 ET T: 06/28/2013 16:57:45 ET JOB#: [id number]  cc: Marcina MillardAlexander  Elisabella Hacker, MD, <Dictator> Marcina MillardALEXANDER Tayona Sarnowski MD ELECTRONICALLY SIGNED 07/11/2013 11:27

## 2015-01-19 NOTE — Consult Note (Signed)
PATIENT NAME:  Hayden Johnson, Hayden Johnson MR#:  [id number] DATE OF BIRTH:  September 29, 1929  DATE OF CONSULTATION:  06/29/2013  CONSULTING PHYSICIAN:  Hatsuko Bizzarro Lilian Kapur, MD  REFERRING PHYSICIAN: Dr. Dustin Flock.   REASON FOR CONSULTATION: Evaluation and management of chronic kidney disease stage V.   HISTORY OF PRESENT ILLNESS: The patient is a very pleasant 81 year old Caucasian male with past medical history of hypertension, chronic kidney disease stage V, anemia of chronic kidney disease, secondary hyperparathyroidism and nephrolithiasis, GERD, coronary artery disease, left upper extremity AV fistula placement, who presented to [facility] with shortness of breath. The patient reports to me that he has known history of underlying chronic kidney disease. He is followed at the [facility]ph[facility] clinic. Records were received from the [place] and we also contacted providers from his [place] [facility] clinic. It appears that the patient has severe underlying chronic kidney disease, stage V. Most recent outpatient eGFR was found to be 11. He had a left upper extremity AV fistula placed on 04/09/2013. His access appears to be maturing nicely. The patient presented with shortness of breath and was found to have pulmonary edema on chest x-ray. Yesterday, the patient had 1 liter of urine output. However, he remains on supplemental oxygen at this point in time. He was receiving Lasix 40 mg IV every 12 hours; however, this has been placed on hold. We were asked to evaluate the patient for initiation of dialysis. His providers at the [place] stated that they were considering dialysis in the relatively short term.   PAST MEDICAL HISTORY: 1.  Chronic kidney disease stage V previously followed at the [facility] nephrology clinic.  2.  Hypertension.  3.  Hyperlipidemia.  4.  Anemia of chronic kidney disease.  5.  Secondary hyperparathyroidism.  6.  Sleep apnea.  7.  Peripheral vascular disease status post left carotid endarterectomy.   8.  Left upper extremity AV fistula placement.  9.  Nephrolithiasis.  10.  GERD.  11.  Depression   ALLERGIES: NOVOCAIN.   CURRENT INPATIENT MEDICATIONS:  Include acetaminophen 650 mg p.o. every 4 hours p.r.n., aspirin 81 mg daily, diltiazem 240 mg p.o. daily, heparin 5000 units subcutaneous every 8 hours, Solu-Medrol 80 mg IV every 8 hours, nicotine patch 21 mg transdermal daily, Zofran 4 mg IV every 4 hours p.r.n., pantoprazole 40 mg p.o. every 6:00 a.m.  SOCIAL HISTORY The patient resides in [place]. He states that he is divorced but his ex-wife continues to live with him. He reports tobacco abuse and was smoking 5 cigarettes per day prior to admission. Denies alcohol or illicit drug use.   FAMILY HISTORY: The patient's father is deceased and had history of alcoholism. The patient's mother deceased, and he reports that she had a history of hypertension.   REVIEW OF SYSTEMS:  GENERAL:  The patient denies fevers or chills. He is unclear as to whether he has had some weight loss.  EYES: Denies diplopia, blurry vision.  HEENT: Denies headaches, hearing loss, tinnitus.  CARDIOVASCULAR: Denies chest pains or palpitations. Does report dyspnea with exertion.  RESPIRATORY: Has had a cough as well as shortness of breath, but denies hemoptysis.  GASTROINTESTINAL: Denies vomiting. Does have some nausea and dysgeusia.  GENITOURINARY: Denies frequency, urgency. Does have history of chronic kidney disease stage V.  MUSCULOSKELETAL: Denies joint pain, swelling, or redness.  INTEGUMENTARY: Denies skin rashes or lesions.  NEUROLOGIC: Denies focal extremity numbness, weakness, or tingling.  PSYCHIATRIC: Has history of depression.  ENDOCRINE: Denies polyuria, polydipsia.  HEMATOLOGIC AND LYMPHATIC: Denies easy bruisability, bleeding, or swollen lymph nodes, but does have history of anemia of chronic kidney disease.  ALLERGY/IMMUNOLOGIC: Denies seasonal allergies or history of immunodeficiency.    PHYSICAL EXAMINATION: VITAL SIGNS: Temperature 98.8, pulse 65, respirations 21, blood pressure 135/72, and pulse ox 93% on 4 liters.  GENERAL: Reveals a well-developed, well-nourished Caucasian male who appears his stated age, currently in no acute distress.  HEENT: Normocephalic, atraumatic. Extraocular movements are intact. Pupils equal, round, and reactive to light. No scleral icterus. Conjunctivae are pink. No epistaxis noted. Gross hearing intact. Oral mucosa moist. Dentures are noted.  NECK: Supple and without lymphadenopathy.  LUNGS: Demonstrate mild basilar rales. Otherwise, good air entry noted.  CARDIOVASCULAR: S1, S2. Regular rate and rhythm. No obvious pericardial friction rub.  ABDOMEN: Soft, nontender, nondistended. Bowel sounds positive. No rebound or guarding. No gross organomegaly appreciated.  EXTREMITIES: No clubbing or cyanosis noted. Trace lower extremity edema noted.  NEUROLOGIC: The patient is alert and oriented to time, person, and place. Strength is 5 out of 5 in both upper and lower extremities.  SKIN: Warm and dry. No rashes noted.  MUSCULOSKELETAL: No joint redness, swelling or tenderness appreciated.  GENITOURINARY: No suprapubic tenderness is noted at this time. Foley catheter noted to be in place.  PSYCHIATRIC: The patient has an appropriate affect and appears to have good insight into his current illness.   LABORATORY DATA: CBC shows WBC 7.8, hemoglobin 9, hematocrit 26, platelets of 95. BMP shows sodium 139, potassium 4.9, chloride 107, CO2 22, BUN 69, creatinine 5.5, glucose 164. Lipid profile shows cholesterol 136, triglycerides 56, HDL 46, LDL 79. Troponin 0.1. 2-D echocardiogram reveals ejection fraction of 55% to 60% with left ventricular hypertrophy noted. Urinalysis shows urine protein 100 mg/dL, 1 RBC per high-power field and 1 WBC per high-power field.   IMPRESSION: This is an 81 year old Caucasian male with past medical history of chronic kidney disease  stage V, hypertension anemia of chronic kidney disease, secondary hyperparathyroidism, depression, gastroesophageal reflux disease, coronary artery disease, history of carotid artery stenosis, status post left carotid endarterectomy who presented to [facility] with shortness of breath and found to have pulmonary edema as well as progressive underlying chronic kidney disease.   1.  End-stage renal disease. It appears that the patient has long-standing chronic kidney disease, which is likely progressive in nature. His eGFR as an outpatient was 11 and current eGFR is 9. The patient does have manifestations of end-stage renal disease including volume overload, nausea and dysgeusia. Therefore, it appears that dialysis is indicated at this time. We talked with the patient's outpatient nephrologist and they were in agreement that dialysis should likely be pursued. The patient has a left upper extremity AV fistula in place, which was placed in 03/2013. It appears that there is significant maturation of the access. Therefore, we will attempt the initiation of dialysis using his AV fistula. We will likely need to use 17-gauge needles with low blood flow. We will plan for dialysis today using a blood flow rate of 150, dialysis flow rate of 300 and a planned ultrafiltration goal of 0.5 kg. We will also begin the search for outpatient dialysis centers for the patient. It appears that he is interested in [facility] dialysis on [address] at present. We will plan for dialysis therapy tomorrow as well.  2.  Anemia of chronic kidney disease. Hemoglobin noted as being 9.1. We will initiate the patient on Procrit with dialysis. We will also check  serum iron studies. We will also check SPEP and UPEP.  3.  Secondary hyperparathyroidism. We will check intact PTH as well as phosphorus with dialysis today.  4.  Pulmonary edema. This was manifested on his chest x-ray. The patient actually had a normal ejection  fraction. We will perform ultrafiltration 0.5 kg today. We will also plan for additional ultrafiltration tomorrow with dialysis. Hopefully this should improve his condition.  5.  I would like to thank Dr. Posey Pronto for this kind referral. Further plan as the patient progresses.      ____________________________ Tama High, MD [id number] D: 06/29/2013 14:39:02 ET T: 06/29/2013 15:34:14 ET JOB#: [id number]  cc: Tama High, MD, <Dictator> Mariah Milling Makenlee Mckeag MD ELECTRONICALLY SIGNED 08/17/2013 10:00

## 2015-01-19 NOTE — Consult Note (Signed)
(  Removed):  Electronic Signatures for Addendum Section:  Alwyn Peaallwood, Diahann Guajardo D (MD) (Signed Addendum 05-Oct-14 08:26)  Wrong pt   Electronic Signatures: Dorothyann Pengallwood, Roshan Salamon D (MD)  (Signed 05-Oct-14 08:25)  Authored: Brief Consult Note   Last Updated: 05-Oct-14 08:26 by Alwyn Peaallwood, Mariel Lukins D (MD)

## 2015-01-24 NOTE — Op Note (Signed)
PATIENT NAME:  Hayden Johnson, Hayden Johnson MR#:  [id number] DATE OF BIRTH:  11-05-1928  DATE OF PROCEDURE:  09/13/2014  PREOPERATIVE DIAGNOSES:  1. End-stage renal disease.  2. Poorly functioning left arm arteriovenous fistula.   POSTOPERATIVE DIAGNOSES:  1. End-stage renal disease. 2. Poorly functioning left arm arteriovenous fistula.  PROCEDURES PERFORMED: 1. Ultrasound guidance for vascular access to left radiocephalic arteriovenous fistula. 2. Left upper extremity fistulogram and central venogram.  3. Percutaneous transluminal angioplasty of mid forearm cephalic vein between access sites with 7 and 8 mm diameter high-pressure angioplasty balloons. 4. Percutaneous transluminal angioplasty of cephalic vein at antecubital fossa beyond the venous access site with 7 and 8 mm diameter high-pressure angioplasty balloons.   SURGEON: Annice Needy, M.D.   ANESTHESIA: Local with monitored conscious sedation.   ESTIMATED BLOOD LOSS: Minimal.   INDICATION FOR PROCEDURE: This is an 81 year old gentleman with end-stage renal disease. His dialysis access center contact us due to poor flows and asked that he have a fistulogram for further evaluation. Risks and benefits were discussed. Informed consent was obtained.   DESCRIPTION OF PROCEDURE: The patient was brought to the vascular suite. The left upper extremity was sterilely prepped and draped, and a sterile surgical field was created.   The fistula was accessed near the anastomosis under direct ultrasound guidance with a micropuncture needle, and micropuncture wire and sheath were then placed. There were aneurysmal segments at the arterial and venous access sites on initial imaging. There was nearly a string sign stenosis between the arterial and venous access sites over about a 3 to 4 cm segment that was very high-grade. Due to this, we could actually see the arterial anastomosis opacified well, which was widely patent without significant stenosis.   Beyond the  venous access site was mildly aneurysmal and then beyond the venous access site was another high-grade stenosis in the 80% to 90% range of the cephalic vein at the antecubital fossa. This was separate and distinct from the stenosis between access sites.  In the upper arm, there was dual outflow between the cephalic vein and the basilic vein, and the central venous circulation did not opacify particularly well, but it did not appear to have any significant stenosis. I crossed these lesions without difficulty with a Magic torque wire, a 6 French sheath was placed and the patient was given 3000 units of intravenous heparin. These lesions were treated separately, initially with a 7 mm diameter angioplasty balloon first inflated at the antecubital fossa, then pulled back and inflated between the access sites to treat both lesions separately and distinctly. This resulted in significant improvement, but there was still some narrowing and so I upsized to an 8 mm diameter angioplasty balloon. Again treated both areas separately with the high-pressure angioplasty balloon inflated to about 20 atmospheres.  Completion angiogram following this showed both areas to be markedly improved there was some mild residual stenosis between the access sites that was in the 20 to 30% range, but not flow limiting. At the antecubital fossa the stenosis was actually a little less, more in the 10% to 15% range.   At this point, I elected to terminate the procedure. The sheath was removed, 4-0 Monocryl pursestring suture was placed. Pressure was held. Sterile dressings were placed. The patient tolerated the procedure well and was taken to the recovery room in stable condition.      ____________________________ Annice Needy, MD [id number] D: 09/13/2014 09:48:48 ET T: 09/13/2014 17:07:02 ET JOB#: [id number]  cc: Barbara Cower  Driscilla GrammesS. Dew, MD, <Dictator> Annice NeedyJASON S DEW MD ELECTRONICALLY SIGNED 10/02/2014 14:09

## 2015-12-18 ENCOUNTER — Other Ambulatory Visit
Admission: RE | Admit: 2015-12-18 | Discharge: 2015-12-18 | Disposition: A | Discharge status: Home / Self Care | Payer: Self-pay | Source: Ambulatory Visit | Attending: Internal Medicine | Admitting: Internal Medicine

## 2015-12-18 LAB — POTASSIUM: Potassium: 5 mmol/L (ref 3.5–5.1)

## 2016-03-29 DEATH — deceased
